# Patient Record
Sex: Male | Born: 1980 | Race: Black or African American | Hispanic: No | Marital: Single | Smoking: Current every day smoker
Health system: Southern US, Community
[De-identification: ages and names within clinical notes are randomized; demographics above are authoritative.]

---

## 2013-03-12 ENCOUNTER — Encounter (HOSPITAL_COMMUNITY): Payer: Self-pay | Admitting: *Deleted

## 2013-03-12 ENCOUNTER — Emergency Department (HOSPITAL_COMMUNITY): Payer: Self-pay

## 2013-03-12 ENCOUNTER — Emergency Department (HOSPITAL_COMMUNITY)
Admission: EM | Admit: 2013-03-12 | Discharge: 2013-03-13 | Disposition: A | Discharge status: Home / Self Care | Payer: Self-pay | Attending: Emergency Medicine | Admitting: Emergency Medicine

## 2013-03-12 DIAGNOSIS — Y939 Activity, unspecified: Secondary | ICD-10-CM | POA: Insufficient documentation

## 2013-03-12 DIAGNOSIS — S51809A Unspecified open wound of unspecified forearm, initial encounter: Secondary | ICD-10-CM | POA: Insufficient documentation

## 2013-03-12 DIAGNOSIS — W268XXA Contact with other sharp object(s), not elsewhere classified, initial encounter: Secondary | ICD-10-CM | POA: Insufficient documentation

## 2013-03-12 DIAGNOSIS — F17200 Nicotine dependence, unspecified, uncomplicated: Secondary | ICD-10-CM | POA: Insufficient documentation

## 2013-03-12 DIAGNOSIS — IMO0002 Reserved for concepts with insufficient information to code with codable children: Secondary | ICD-10-CM

## 2013-03-12 DIAGNOSIS — S6990XA Unspecified injury of unspecified wrist, hand and finger(s), initial encounter: Secondary | ICD-10-CM | POA: Insufficient documentation

## 2013-03-12 DIAGNOSIS — Y929 Unspecified place or not applicable: Secondary | ICD-10-CM | POA: Insufficient documentation

## 2013-03-12 MED ORDER — LIDOCAINE-EPINEPHRINE (PF) 2 %-1:200000 IJ SOLN
INTRAMUSCULAR | Status: AC
  Administered 2013-03-13
  Filled 2013-03-12: qty 20
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
## 2013-03-13 MED ORDER — HYDROCODONE-ACETAMINOPHEN 5-325 MG PO TABS: 2.0000 | ORAL_TABLET | ORAL | Status: AC | PRN

## 2013-03-13 MED ORDER — SULFAMETHOXAZOLE-TRIMETHOPRIM 800-160 MG PO TABS: 1.0000 | ORAL_TABLET | Freq: Two times a day (BID) | ORAL | Status: AC

## 2013-03-13 NOTE — ED Notes (Signed)
Discharge instructions reviewed with pt, questions answered. Pt verbalized understanding.  

## 2013-03-27 ENCOUNTER — Encounter (HOSPITAL_COMMUNITY): Payer: Self-pay | Admitting: *Deleted

## 2013-03-27 ENCOUNTER — Emergency Department (HOSPITAL_COMMUNITY)
Admission: EM | Admit: 2013-03-27 | Discharge: 2013-03-27 | Disposition: A | Discharge status: Home / Self Care | Payer: Self-pay | Attending: Emergency Medicine | Admitting: Emergency Medicine

## 2013-03-27 DIAGNOSIS — Z4802 Encounter for removal of sutures: Secondary | ICD-10-CM | POA: Insufficient documentation

## 2013-03-27 DIAGNOSIS — F17200 Nicotine dependence, unspecified, uncomplicated: Secondary | ICD-10-CM | POA: Insufficient documentation
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
## 2013-03-27 NOTE — ED Provider Notes (Signed)
Medical screening examination/treatment/procedure(s) were performed by non-physician practitioner and as supervising physician I was immediately available for consultation/collaboration.   Laray Anger, DO 03/27/13 1850

## 2013-03-27 NOTE — ED Notes (Signed)
Laceration to right forearm

## 2013-03-27 NOTE — ED Notes (Signed)
Removal of sutures from R posterior forearm.
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
## 2021-09-17 ENCOUNTER — Emergency Department (HOSPITAL_COMMUNITY)
Admission: EM | Admit: 2021-09-17 | Discharge: 2021-09-17 | Disposition: A | Discharge status: Home / Self Care | Payer: 59 | Attending: Emergency Medicine | Admitting: Emergency Medicine

## 2021-09-17 ENCOUNTER — Other Ambulatory Visit: Payer: Self-pay

## 2021-09-17 ENCOUNTER — Encounter (HOSPITAL_COMMUNITY): Payer: Self-pay

## 2021-09-17 ENCOUNTER — Emergency Department (HOSPITAL_COMMUNITY): Payer: 59

## 2021-09-17 DIAGNOSIS — F17200 Nicotine dependence, unspecified, uncomplicated: Secondary | ICD-10-CM | POA: Insufficient documentation

## 2021-09-17 DIAGNOSIS — Z79899 Other long term (current) drug therapy: Secondary | ICD-10-CM | POA: Diagnosis not present

## 2021-09-17 DIAGNOSIS — R072 Precordial pain: Secondary | ICD-10-CM

## 2021-09-17 DIAGNOSIS — R1084 Generalized abdominal pain: Secondary | ICD-10-CM

## 2021-09-17 LAB — COMPREHENSIVE METABOLIC PANEL
ALT: 32 U/L (ref 0–44)
AST: 17 U/L (ref 15–41)
Albumin: 4.6 g/dL (ref 3.5–5.0)
Alkaline Phosphatase: 58 U/L (ref 38–126)
Anion gap: 5 (ref 5–15)
BUN: 11 mg/dL (ref 6–20)
CO2: 28 mmol/L (ref 22–32)
Calcium: 9 mg/dL (ref 8.9–10.3)
Chloride: 101 mmol/L (ref 98–111)
Creatinine, Ser: 0.8 mg/dL (ref 0.61–1.24)
GFR, Estimated: >60 mL/min (ref >60)
Glucose, Bld: 90 mg/dL (ref 70–99)
Potassium: 4.3 mmol/L (ref 3.5–5.1)
Sodium: 134 mmol/L — ABNORMAL LOW (ref 135–145)
Total Bilirubin: 0.5 mg/dL (ref 0.3–1.2)
Total Protein: 7.6 g/dL (ref 6.5–8.1)

## 2021-09-17 LAB — CBC WITH DIFFERENTIAL/PLATELET
Abs Immature Granulocytes: 0.04 10*3/uL (ref 0.00–0.07)
Basophils Absolute: 0 10*3/uL (ref 0.0–0.1)
Basophils Relative: 0 %
Eosinophils Absolute: 0.2 10*3/uL (ref 0.0–0.5)
Eosinophils Relative: 4 %
HCT: 47.1 % (ref 39.0–52.0)
Hemoglobin: 15.9 g/dL (ref 13.0–17.0)
Immature Granulocytes: 1 %
Lymphocytes Relative: 33 %
Lymphs Abs: 1.7 10*3/uL (ref 0.7–4.0)
MCH: 32.1 pg (ref 26.0–34.0)
MCHC: 33.8 g/dL (ref 30.0–36.0)
MCV: 95.2 fL (ref 80.0–100.0)
Monocytes Absolute: 0.4 10*3/uL (ref 0.1–1.0)
Monocytes Relative: 7 %
Neutro Abs: 2.9 10*3/uL (ref 1.7–7.7)
Neutrophils Relative %: 55 %
Platelets: 244 10*3/uL (ref 150–400)
RBC: 4.95 MIL/uL (ref 4.22–5.81)
RDW: 12.7 % (ref 11.5–15.5)
WBC: 5.3 10*3/uL (ref 4.0–10.5)
nRBC: 0 % (ref 0.0–0.2)

## 2021-09-17 LAB — URINALYSIS, ROUTINE W REFLEX MICROSCOPIC
Bilirubin Urine: NEGATIVE
Glucose, UA: NEGATIVE mg/dL
Hgb urine dipstick: NEGATIVE
Ketones, ur: NEGATIVE mg/dL
Leukocytes,Ua: NEGATIVE
Nitrite: NEGATIVE
Protein, ur: NEGATIVE mg/dL
Specific Gravity, Urine: 1.017 (ref 1.005–1.030)
pH: 7 (ref 5.0–8.0)

## 2021-09-17 LAB — TROPONIN I (HIGH SENSITIVITY): Troponin I (High Sensitivity): 3 ng/L (ref <18)

## 2021-09-17 LAB — LIPASE, BLOOD: Lipase: 23 U/L (ref 11–51)

## 2021-09-17 MED ORDER — MORPHINE SULFATE (PF) 4 MG/ML IV SOLN
4.0000 mg | Freq: Once | INTRAVENOUS | Status: AC
  Administered 2021-09-17: 11:00:00 4 mg via INTRAVENOUS
  Filled 2021-09-17: qty 1

## 2021-09-17 MED ORDER — ONDANSETRON HCL 4 MG/2ML IJ SOLN
4.0000 mg | Freq: Once | INTRAMUSCULAR | Status: AC
  Administered 2021-09-17: 11:00:00 4 mg via INTRAVENOUS
  Filled 2021-09-17: qty 2

## 2021-09-17 MED ORDER — ONDANSETRON HCL 4 MG PO TABS: 4.0000 mg | ORAL_TABLET | Freq: Four times a day (QID) | ORAL | 0 refills | Status: AC

## 2021-09-17 NOTE — Discharge Instructions (Signed)
Your labs, exam and x-rays today are reassuring.  Since your symptoms are improved, you are being discharged at this time.  I have prescribed you some Zofran if needed for continued nausea.  I also recommend taking Tylenol 650 mg every 4-6 hours if needed for continued chest pain symptoms.  Heating pad applied to your chest wall may also be helpful as I suspect this is a muscle or rib cage strain causing your chest discomfort.  Get rechecked with your primary doctor or by returning here if you develop any worsened or persistent symptoms.

## 2021-09-17 NOTE — ED Notes (Signed)
Given water for fluid challenge.   

## 2021-09-17 NOTE — ED Notes (Signed)
Pt removed all cardiac leads and BP cuffs.  Requested IV be taken out, PA informed.

## 2021-09-17 NOTE — ED Triage Notes (Signed)
Pt reports r sided chest pain since Sunday.  Reports worse with movement.  C/O generalized abd pain that started Tuesday night.  Denies n/v/d.  LBM was today and was normal per pt.  Denies chest pain at this time but says it comes and goes during the night.  Also c/o lower back pain.  Denies any urinary symptoms.

## 2021-09-17 NOTE — ED Notes (Signed)
Tolerating fluids well.   

## 2021-09-17 NOTE — ED Notes (Signed)
PA at bedside.

## 2021-09-17 NOTE — ED Provider Notes (Signed)
[facility] EMERGENCY DEPARTMENT Provider Note   CSN: [id number] Arrival date & time: 09/17/21  6568     History Chief Complaint  Patient presents with   Abdominal Pain    Kirk Torres is a 40 y.o. male with no significant past medical history presenting with a 5-day history of intermittent symptoms.  He describes initially having a soreness type pain in his right chest which started 5 days ago.  It was worse with palpation, twisting his torso and deep inspiration, he thought he may have strained his chest lifting some heavy objects at work.  However he has had intermittent episodes of return of this pain, now located midsternal along with now generalized abdominal pain.  He endorses nausea without emesis, he denies fevers or chills, no diarrhea.  He does have a history of intermittent problems with constipation however his last bowel movement was early this morning which did not relieve his symptoms.  Also denies dysuria or reduced urinary frequency.  He has had no treatments for symptoms prior to arrival.  He denies shortness of breath or cough, palpitations, dizziness.  No meds or tx prior to arrival.  The history is provided by the patient.      History reviewed. No pertinent past medical history.  There are no problems to display for this patient.   History reviewed. No pertinent surgical history.     No family history on file.  Social History   Tobacco Use   Smoking status: Every Day  Substance Use Topics   Alcohol use: No   Drug use: No    Home Medications Prior to Admission medications   Medication Sig Start Date End Date Taking? Authorizing Provider  ondansetron (ZOFRAN) 4 MG tablet Take 1 tablet (4 mg total) by mouth every 6 (six) hours. 09/17/21  Yes Juna Caban, Raynelle Fanning, PA-C  HYDROcodone-acetaminophen (NORCO/VICODIN) 5-325 MG per tablet Take 2 tablets by mouth every 4 (four) hours as needed for pain. Patient not taking: Reported on 09/17/2021 03/13/13   Gilda Crease, MD  sulfamethoxazole-trimethoprim (SEPTRA DS) 800-160 MG per tablet Take 1 tablet by mouth 2 (two) times daily. Patient not taking: Reported on 09/17/2021 03/13/13   Gilda Crease, MD    Allergies    Patient has no known allergies.  Review of Systems   Review of Systems  Constitutional:  Negative for fever.  HENT:  Negative for congestion and sore throat.   Eyes: Negative.   Respiratory:  Negative for chest tightness and shortness of breath.   Cardiovascular:  Positive for chest pain.  Gastrointestinal:  Positive for abdominal pain, constipation and nausea. Negative for diarrhea and vomiting.  Genitourinary: Negative.   Musculoskeletal:  Negative for arthralgias, joint swelling and neck pain.  Skin: Negative.  Negative for rash and wound.  Neurological:  Negative for dizziness, weakness, light-headedness, numbness and headaches.  Psychiatric/Behavioral: Negative.     Physical Exam Updated Vital Signs BP 134/85    Pulse 72    Temp (!) 97.5 F (36.4 C) (Oral)    Resp 17    Ht 5\' 11"  (1.803 m)    Wt 103 kg    SpO2 100%    BMI 31.66 kg/m   Physical Exam Vitals and nursing note reviewed.  Constitutional:      Appearance: He is well-developed.  HENT:     Head: Normocephalic and atraumatic.  Eyes:     Conjunctiva/sclera: Conjunctivae normal.  Cardiovascular:     Rate and Rhythm: Normal rate and regular  rhythm.     Heart sounds: Normal heart sounds.  Pulmonary:     Effort: Pulmonary effort is normal.     Breath sounds: Normal breath sounds. No wheezing.  Chest:     Chest wall: Tenderness present.     Comments: Mild ttp lower mid sternal region. Abdominal:     General: Abdomen is flat. Bowel sounds are normal.     Palpations: Abdomen is soft.     Tenderness: There is generalized abdominal tenderness. There is no guarding or rebound. Negative signs include Murphy's sign and McBurney's sign.  Musculoskeletal:        General: Normal range of motion.      Cervical back: Normal range of motion.  Skin:    General: Skin is warm and dry.  Neurological:     Mental Status: He is alert.    ED Results / Procedures / Treatments   Labs (all labs ordered are listed, but only abnormal results are displayed) Labs Reviewed  COMPREHENSIVE METABOLIC PANEL - Abnormal; Notable for the following components:      Result Value   Sodium 134 (*)    All other components within normal limits  CBC WITH DIFFERENTIAL/PLATELET  LIPASE, BLOOD  URINALYSIS, ROUTINE W REFLEX MICROSCOPIC  TROPONIN I (HIGH SENSITIVITY)    EKG EKG Interpretation  Date/Time:  Thursday September 17 2021 10:04:25 EST Ventricular Rate:  71 PR Interval:  138 QRS Duration: 88 QT Interval:  380 QTC Calculation: 413 R Axis:   23 Text Interpretation: Sinus rhythm Borderline T abnormalities, inferior leads No old tracing to compare Confirmed by Susy Frizzle [id number]) on 09/17/2021 10:27:17 AM  Radiology DG ABD ACUTE 2+V W 1V CHEST  Result Date: 09/17/2021 CLINICAL DATA:  Abdominal and chest pain EXAM: DG ABDOMEN ACUTE WITH 1 VIEW CHEST COMPARISON:  None. FINDINGS: There is no evidence of dilated bowel loops or free intraperitoneal air. No radiopaque calculi or other significant radiographic abnormality is seen. Heart size and mediastinal contours are within normal limits. Both lungs are clear. Degenerative changes of the hips noted, right greater than left. IMPRESSION: No acute process identified in the chest or abdomen. Electronically Signed   By: Jannifer Hick M.D.   On: 09/17/2021 10:57    Procedures Procedures   Medications Ordered in ED Medications  ondansetron (ZOFRAN) injection 4 mg (4 mg Intravenous Given 09/17/21 1124)  morphine 4 MG/ML injection 4 mg (4 mg Intravenous Given 09/17/21 1124)    ED Course  I have reviewed the triage vital signs and the nursing notes.  Pertinent labs & imaging results that were available during my care of the patient were reviewed by  me and considered in my medical decision making (see chart for details).    MDM Rules/Calculators/A&P                         Labs and imaging reviewed, ekg reviewed.  CP is reproducible, negative troponin, suspect chest wall strain, this is not ACS.  Abd pain resolved, serial exam prior to dc, benign abd, pt felt well at time of dc.  ABd pain of unclear etiology but appears resolved at this time.  Normal WBC count, no guarding, abd series with normal BGP, no obstruction, doubt intraabdominal infection/early appy.  He was given return instructions, otw , zofran prescribed, prn f/u anticipated.  The patient appears reasonably screened and/or stabilized for discharge and I doubt any other medical condition or other [facility] requiring further screening, evaluation,  or treatment in the ED at this time prior to discharge.     Final Clinical Impression(s) / ED Diagnoses Final diagnoses:  Generalized abdominal pain  Precordial pain    Rx / DC Orders ED Discharge Orders          Ordered    ondansetron (ZOFRAN) 4 MG tablet  Every 6 hours        09/17/21 1324             Burgess Amor, PA-C 09/18/21 1424    Pollyann Savoy, MD 09/18/21 [phone]
